# Patient Record
Sex: Male | Born: 1970 | Race: White | Hispanic: No | Marital: Single | Smoking: Never smoker
Health system: Southern US, Community
[De-identification: ages and names within clinical notes are randomized; demographics above are authoritative.]

---

## 2018-01-01 ENCOUNTER — Emergency Department (HOSPITAL_BASED_OUTPATIENT_CLINIC_OR_DEPARTMENT_OTHER)
Admission: EM | Admit: 2018-01-01 | Discharge: 2018-01-01 | Disposition: A | Discharge status: Home / Self Care | Payer: Self-pay | Attending: Emergency Medicine | Admitting: Emergency Medicine

## 2018-01-01 ENCOUNTER — Encounter (HOSPITAL_BASED_OUTPATIENT_CLINIC_OR_DEPARTMENT_OTHER): Payer: Self-pay | Admitting: Emergency Medicine

## 2018-01-01 ENCOUNTER — Other Ambulatory Visit: Payer: Self-pay

## 2018-01-01 ENCOUNTER — Emergency Department (HOSPITAL_BASED_OUTPATIENT_CLINIC_OR_DEPARTMENT_OTHER): Payer: Self-pay

## 2018-01-01 DIAGNOSIS — Z79899 Other long term (current) drug therapy: Secondary | ICD-10-CM | POA: Insufficient documentation

## 2018-01-01 DIAGNOSIS — S42201A Unspecified fracture of upper end of right humerus, initial encounter for closed fracture: Secondary | ICD-10-CM | POA: Insufficient documentation

## 2018-01-01 DIAGNOSIS — Y999 Unspecified external cause status: Secondary | ICD-10-CM | POA: Insufficient documentation

## 2018-01-01 DIAGNOSIS — X509XXA Other and unspecified overexertion or strenuous movements or postures, initial encounter: Secondary | ICD-10-CM | POA: Insufficient documentation

## 2018-01-01 DIAGNOSIS — Y92013 Bedroom of single-family (private) house as the place of occurrence of the external cause: Secondary | ICD-10-CM | POA: Insufficient documentation

## 2018-01-01 DIAGNOSIS — Y9389 Activity, other specified: Secondary | ICD-10-CM | POA: Insufficient documentation

## 2018-01-01 DIAGNOSIS — S43014A Anterior dislocation of right humerus, initial encounter: Secondary | ICD-10-CM | POA: Insufficient documentation

## 2018-01-01 DIAGNOSIS — I10 Essential (primary) hypertension: Secondary | ICD-10-CM | POA: Insufficient documentation

## 2018-01-01 MED ORDER — OXYCODONE-ACETAMINOPHEN 5-325 MG PO TABS
1.0000 | ORAL_TABLET | Freq: Once | ORAL | Status: AC
  Administered 2018-01-01: 1 via ORAL
  Filled 2018-01-01: qty 1

## 2018-01-01 MED ORDER — PROPOFOL 10 MG/ML IV BOLUS
INTRAVENOUS | Status: AC | PRN
  Administered 2018-01-01: 3 mg via INTRAVENOUS

## 2018-01-01 MED ORDER — PROPOFOL 10 MG/ML IV BOLUS
INTRAVENOUS | Status: AC
  Filled 2018-01-01: qty 20

## 2018-01-01 MED ORDER — HYDROMORPHONE HCL 1 MG/ML IJ SOLN
INTRAMUSCULAR | Status: AC
  Filled 2018-01-01: qty 1

## 2018-01-01 MED ORDER — LIDOCAINE HCL 1 % IJ SOLN
INTRAMUSCULAR | Status: AC
  Filled 2018-01-01: qty 20

## 2018-01-01 MED ORDER — PROPOFOL 10 MG/ML IV BOLUS
INTRAVENOUS | Status: AC | PRN
  Administered 2018-01-01: 10 mg via INTRAVENOUS

## 2018-01-01 MED ORDER — PROPOFOL 10 MG/ML IV BOLUS
0.5000 mg/kg | Freq: Once | INTRAVENOUS | Status: DC
  Filled 2018-01-01: qty 20

## 2018-01-01 MED ORDER — HYDROMORPHONE HCL 1 MG/ML IJ SOLN
1.0000 mg | Freq: Once | INTRAMUSCULAR | Status: AC
  Administered 2018-01-01: 1 mg via INTRAVENOUS
  Filled 2018-01-01: qty 1

## 2018-01-01 MED ORDER — PROPOFOL 10 MG/ML IV BOLUS
INTRAVENOUS | Status: AC | PRN
  Administered 2018-01-01: 20 mg via INTRAVENOUS

## 2018-01-01 MED ORDER — KETAMINE HCL 10 MG/ML IJ SOLN
INTRAMUSCULAR | Status: AC | PRN
  Administered 2018-01-01: 83.9 mg via INTRAVENOUS

## 2018-01-01 MED ORDER — PROPOFOL 10 MG/ML IV BOLUS
INTRAVENOUS | Status: AC | PRN
  Administered 2018-01-01: 20 mg via INTRAVENOUS
  Administered 2018-01-01: 45 mg via INTRAVENOUS
  Administered 2018-01-01: 20 mg via INTRAVENOUS
  Administered 2018-01-01: 40 mg via INTRAVENOUS
  Administered 2018-01-01: 20 mg via INTRAVENOUS

## 2018-01-01 MED ORDER — HYDROMORPHONE HCL 1 MG/ML IJ SOLN
1.0000 mg | Freq: Once | INTRAMUSCULAR | Status: AC
  Administered 2018-01-01: 1 mg via INTRAVENOUS

## 2018-01-01 MED ORDER — KETOROLAC TROMETHAMINE 15 MG/ML IJ SOLN
15.0000 mg | Freq: Once | INTRAMUSCULAR | Status: AC
  Administered 2018-01-01: 15 mg via INTRAVENOUS
  Filled 2018-01-01: qty 1

## 2018-01-01 MED ORDER — KETAMINE HCL 10 MG/ML IJ SOLN
INTRAMUSCULAR | Status: AC
  Filled 2018-01-01: qty 1

## 2018-01-01 MED ORDER — SODIUM CHLORIDE 0.9 % IV BOLUS: 500.0000 mL | Freq: Once | INTRAVENOUS | Status: DC

## 2018-01-01 MED ORDER — PROPOFOL 10 MG/ML IV BOLUS
INTRAVENOUS | Status: AC | PRN
  Administered 2018-01-01 (×3): 40 mg via INTRAVENOUS
  Administered 2018-01-01: 83.9 mg via INTRAVENOUS
  Administered 2018-01-01: 20 mg via INTRAVENOUS
  Administered 2018-01-01: 40 mg via INTRAVENOUS

## 2018-01-01 MED ORDER — ONDANSETRON HCL 4 MG/2ML IJ SOLN
4.0000 mg | Freq: Once | INTRAMUSCULAR | Status: AC
  Administered 2018-01-01: 4 mg via INTRAVENOUS
  Filled 2018-01-01: qty 2

## 2018-01-01 MED ORDER — HYDROCODONE-ACETAMINOPHEN 5-325 MG PO TABS: 1.0000 | ORAL_TABLET | Freq: Four times a day (QID) | ORAL | 0 refills | Status: AC | PRN

## 2018-01-01 MED FILL — HYDROCODON-APAP 5-325: 5-325 | 2 days supply | Qty: 10 | Fill #0

## 2018-01-01 NOTE — ED Provider Notes (Signed)
[facility] EMERGENCY DEPARTMENT Provider Note   CSN: [id number] Arrival date & time: 01/01/18  0736     History   Chief Complaint Chief Complaint  Patient presents with  . Shoulder Injury    HPI Daniel Francis is a 47 y.o. male.  The history is provided by the patient.  Shoulder Injury  This is a recurrent problem. The current episode started 1 to 2 hours ago. The problem occurs constantly. The problem has not changed since onset.Associated symptoms comments: Severe pain in the right shoulder and unable to move.  Pain is 10/10.  She states he was sleeping this morning with his right arm behind his head and he went to get out of bed and felt a pop and has been unable to move his arm since.  He has had now 3 shoulder dislocations in the last 2 months.  He states prior to that he has had injury to the shoulder from years of wrestling.  He denies any numbness or tingling in his right hand.  He has never seen a specialist about this problem.. The symptoms are aggravated by bending and twisting. Nothing relieves the symptoms. He has tried rest for the symptoms. The treatment provided no relief.    History reviewed. No pertinent past medical history.  There are no active problems to display for this patient.         Home Medications    Prior to Admission medications   Medication Sig Start Date End Date Taking? Authorizing Provider  LOSARTAN POTASSIUM PO Take by mouth.   Yes [provider]    Family History History reviewed. No pertinent family history.  Social History Social History   Tobacco Use  . Smoking status: Not on file  Substance Use Topics  . Alcohol use: Not on file  . Drug use: Not on file     Allergies   Penicillins   Review of Systems Review of Systems  All other systems reviewed and are negative.    Physical Exam Updated Vital Signs BP (!) 137/97   Pulse 81   Temp 98.3 F (36.8 C) (Oral)   Resp 16   Ht 5\' 6"  (1.676 m)    Wt 83.9 kg   SpO2 100%   BMI 29.86 kg/m   Physical Exam  Constitutional: He is oriented to person, place, and time. He appears well-developed and well-nourished. No distress.  HENT:  Head: Normocephalic and atraumatic.  Mouth/Throat: Oropharynx is clear and moist.  Eyes: Pupils are equal, round, and reactive to light. Conjunctivae and EOM are normal.  Neck: Normal range of motion. Neck supple.  Cardiovascular: Normal rate, regular rhythm and intact distal pulses.  No murmur heard. Pulmonary/Chest: Effort normal and breath sounds normal. No respiratory distress. He has no wheezes. He has no rales.  Abdominal: Soft. He exhibits no distension. There is no tenderness. There is no rebound and no guarding.  Musculoskeletal: He exhibits tenderness. He exhibits no edema.       Right shoulder: He exhibits decreased range of motion, tenderness, bony tenderness and deformity. He exhibits normal pulse and normal strength.       Arms: Neurological: He is alert and oriented to person, place, and time.  Skin: Skin is warm and dry. No rash noted. No erythema.  Psychiatric: He has a normal mood and affect. His behavior is normal.  Nursing note and vitals reviewed.    ED Treatments / Results  Labs (all labs ordered are listed, but only  abnormal results are displayed) Labs Reviewed - No data to display  EKG None  Radiology Dg Shoulder 1 View Right  Result Date: 01/01/2018 CLINICAL DATA:  Post reduction of anterior dislocation of the proximal right humerus. EXAM: RIGHT SHOULDER - 1 VIEW 10:13 a.m. COMPARISON:  Radiograph dated 01/01/2018 at 8:12 a.m. FINDINGS: Single AP view demonstrates reduction of the anterior dislocation. Hill-Sachs lesion visible. The small bony avulsion noted on the prior study is not visible on this image. Moderate AC joint arthropathy. Possible hairline fracture of the inferior rim of the glenoid. IMPRESSION: Reduction of anterior dislocation. 1. Reduction of the anterior  dislocation of the humeral head. 2. Hill-Sachs impaction fracture. 3. Possible inferior glenoid rim fracture. 4. The bone avulsion seen on the prior study of this same date is not appreciable on this exam. Electronically Signed   By: Francene Boyers M.D.   On: 01/01/2018 10:51   Dg Shoulder Right  Result Date: 01/01/2018 CLINICAL DATA:  Injury after rolling over in bed this morning EXAM: RIGHT SHOULDER - 1 VIEW COMPARISON:  None FINDINGS: Single AP view. Inferior RIGHT glenohumeral dislocation identified. Slightly ill-defined density is seen adjacent to the RIGHT glenohumeral joint. Osseous mineralization normal. Degenerative changes at RIGHT [facility] joint. No additional fractures identified. Visualized RIGHT ribs intact. IMPRESSION: Inferior RIGHT glenohumeral dislocation. Calcific density adjacent to the shoulder joint is seen, cannot exclude an atypical avulsion fragment though this could be related to other causes including calcific tendinopathy, calcified loose body, capsular calcification. Electronically Signed   By: Ulyses Southward M.D.   On: 01/01/2018 08:49    Procedures .Sedation Date/Time: 01/01/2018 10:24 AM Performed by: Gwyneth Sprout, MD Authorized by: Gwyneth Sprout, MD   Consent:    Consent obtained:  Verbal   Consent given by:  Patient   Risks discussed:  Allergic reaction, dysrhythmia, inadequate sedation, nausea, prolonged hypoxia resulting in organ damage, prolonged sedation necessitating reversal, respiratory compromise necessitating ventilatory assistance and intubation and vomiting   Alternatives discussed:  Analgesia without sedation, anxiolysis and regional anesthesia Universal protocol:    Procedure explained and questions answered to patient or proxy's satisfaction: yes     Relevant documents present and verified: yes     Test results available and properly labeled: yes     Imaging studies available: yes     Required blood products, implants, devices, and special  equipment available: yes     Site/side marked: yes     Immediately prior to procedure a time out was called: yes     Patient identity confirmation method:  Verbally with patient Indications:    Procedure performed:  Dislocation reduction   Procedure necessitating sedation performed by:  Physician performing sedation Pre-sedation assessment:    Time since last food or drink:  Yesterday   ASA classification: class 1 - normal, healthy patient     Neck mobility: normal     Mouth opening:  3 or more finger widths   Thyromental distance:  4 finger widths   Mallampati score:  I - soft palate, uvula, fauces, pillars visible   Pre-sedation assessments completed and reviewed: airway patency, cardiovascular function, hydration status, mental status, nausea/vomiting, pain level, respiratory function and temperature     Pre-sedation assessment completed:  01/01/2018 9:25 AM Immediate pre-procedure details:    Reassessment: Patient reassessed immediately prior to procedure     Reviewed: vital signs, relevant labs/tests and NPO status     Verified: bag valve mask available, emergency equipment available, intubation equipment available, IV patency  confirmed, oxygen available and suction available   Procedure details (see MAR for exact dosages):    Preoxygenation:  Nasal cannula   Sedation:  Propofol and ketamine   Analgesia:  Hydromorphone   Intra-procedure monitoring:  Blood pressure monitoring, cardiac monitor, continuous pulse oximetry, frequent LOC assessments, frequent vital sign checks and continuous capnometry   Intra-procedure events: hypoxia     Intra-procedure management:  Airway repositioning   Total Provider sedation time (minutes):  45 Post-procedure details:    Post-sedation assessment completed:  01/01/2018 10:25 AM   Attendance: Constant attendance by certified staff until patient recovered     Recovery: Patient returned to pre-procedure baseline     Post-sedation assessments  completed and reviewed: airway patency, cardiovascular function, hydration status, mental status, nausea/vomiting, pain level, respiratory function and temperature     Patient is stable for discharge or admission: yes     Patient tolerance:  Tolerated well, no immediate complications Comments:     Patient initially attempted to be sedated with propofol.  Started with 0.5/kg and used a full 1/kg without adequate sedation.  Due to unable to get complete sedation and relaxation ketamine 1/kg was attempted.  This made patient extremely rigid and tense unable to reduce the shoulder.  A 15 to 20-minute period of time was used to allow patient to completely wake up from his sedation and joint was injected without pain control.  Patient then was sedated one last time with propofol.  This time patient required 2.5 mg/kg to get adequate sedation which then resulted in reduction of his right shoulder dislocation.  Patient had a brief episode of hypoxia which improved with jaw thrust for less than 1 minute. Reduction of dislocation Date/Time: 01/01/2018 10:27 AM Performed by: Gwyneth Sprout, MD Authorized by: Gwyneth Sprout, MD  Consent: Written consent obtained. Risks and benefits: risks, benefits and alternatives were discussed Consent given by: patient Patient understanding: patient states understanding of the procedure being performed Patient consent: the patient's understanding of the procedure matches consent given Procedure consent: procedure consent matches procedure scheduled Relevant documents: relevant documents present and verified Imaging studies: imaging studies available Patient identity confirmed: verbally with patient Time out: Immediately prior to procedure a "time out" was called to verify the correct patient, procedure, equipment, support staff and site/side marked as required. Local anesthesia used: yes Anesthesia: local infiltration  Anesthesia: Local anesthesia used:  yes Local Anesthetic: lidocaine 1% without epinephrine Anesthetic total: 10 mL  Sedation: Patient sedated: yes Sedation type: moderate (conscious) sedation Sedatives: propofol Analgesia: hydromorphone Vitals: Vital signs were monitored during sedation.  Patient tolerance: Patient tolerated the procedure well with no immediate complications Comments: Right shoulder dislocation reduced with arm flexion and external rotation.  Postreduction 2+ radial pulse with normal sensation over the deltoid and in the hand.    (including critical care time)  Medications Ordered in ED Medications  propofol (DIPRIVAN) 10 mg/mL bolus/IV push 42 mg (has no administration in time range)  sodium chloride 0.9 % bolus 500 mL (has no administration in time range)  ketamine (KETALAR) 10 MG/ML injection (has no administration in time range)  lidocaine (XYLOCAINE) 1 % (with pres) injection (has no administration in time range)  HYDROmorphone (DILAUDID) injection 1 mg (1 mg Intravenous Given 01/01/18 0821)  ondansetron (ZOFRAN) injection 4 mg (4 mg Intravenous Given 01/01/18 0821)  propofol (DIPRIVAN) 10 mg/mL bolus/IV push ( Intravenous Canceled Entry 01/01/18 1000)  propofol (DIPRIVAN) 10 mg/mL bolus/IV push (20 mg Intravenous Given 01/01/18 0913)  ketamine (KETALAR)  injection (83.9 mg Intravenous Given 01/01/18 0919)  propofol (DIPRIVAN) 10 mg/mL bolus/IV push (10 mg Intravenous Given 01/01/18 0926)  propofol (DIPRIVAN) 10 mg/mL bolus/IV push (3 mg Intravenous Given 01/01/18 0927)  HYDROmorphone (DILAUDID) injection 1 mg (1 mg Intravenous Given 01/01/18 0949)  propofol (DIPRIVAN) 10 mg/mL bolus/IV push (40 mg Intravenous Given 01/01/18 1006)     Initial Impression / Assessment and Plan / ED Course  I have reviewed the triage vital signs and the nursing notes.  Pertinent labs & imaging results that were available during my care of the patient were reviewed by me and considered in my medical decision  making (see chart for details).     Patient presenting with recurrent right shoulder dislocation.  Patient was too uncomfortable to reduce with just lidocaine.  X-ray confirmed dislocation.  Patient was extremely resistant to sedation.  Propofol was given initially at 1-1/2 mg/kg without adequate sedation.  Then attempted to use ketamine which caused rigidity and was not helpful.  After giving the patient a.  Of time to relax and wake up a second attempt with propofol was used.  Patient received between 2.5 to 3 mg/kg of propofol which did result in relaxation and shoulder was able to be reduced.  He is neurovascularly intact.  He is feeling much better.  X-ray confirmed reduction.  Patient will need to follow-up with an orthopedist given this is now his third dislocation in 2 months.  11:26 AM X-ray confirmed reduction however there is a Hill-Sachs impaction fracture and possible inferior glenoid rim fracture.  Findings were discussed with the patient.  He was placed in a shoulder immobilizer.  Patient is also hypertensive today but states he has a history of hypertension and is not taking his medication.  He will take his medication when he gets home and to keep an eye on it and if it remains elevated he will follow-up with his PCP. Final Clinical Impressions(s) / ED Diagnoses   Final diagnoses:  Anterior dislocation of right shoulder, initial encounter    ED Discharge Orders         Ordered    HYDROcodone-acetaminophen (NORCO/VICODIN) 5-325 MG tablet  Every 6 hours PRN     01/01/18 1128           Gwyneth Sprout, MD 01/01/18 1130

## 2018-01-01 NOTE — ED Triage Notes (Signed)
Reports rolling over in bed this morning and right shoulder popping.  States previous dislocations x 2 in the past 2 months.

## 2018-01-01 NOTE — Discharge Instructions (Signed)
Wear the sling immobilizer all the time except when showering.  Make sure you do not raise it your right arm above your head.  Take ibuprofen for the pain but you were prescribed a stronger pain medication if that does not work.  Ice intermittently no more than 20 minutes at a time for the next few days.

## 2018-01-10 ENCOUNTER — Ambulatory Visit (INDEPENDENT_AMBULATORY_CARE_PROVIDER_SITE_OTHER): Payer: Self-pay

## 2018-01-10 ENCOUNTER — Ambulatory Visit (INDEPENDENT_AMBULATORY_CARE_PROVIDER_SITE_OTHER): Payer: Self-pay | Admitting: Orthopaedic Surgery

## 2018-01-10 ENCOUNTER — Encounter (INDEPENDENT_AMBULATORY_CARE_PROVIDER_SITE_OTHER): Payer: Self-pay

## 2018-01-10 ENCOUNTER — Encounter (INDEPENDENT_AMBULATORY_CARE_PROVIDER_SITE_OTHER): Payer: Self-pay | Admitting: Orthopaedic Surgery

## 2018-01-10 DIAGNOSIS — M25511 Pain in right shoulder: Secondary | ICD-10-CM

## 2018-01-10 MED ORDER — HYDROCODONE-ACETAMINOPHEN 5-325 MG PO TABS: 1.0000 | ORAL_TABLET | Freq: Every evening | ORAL | 0 refills | Status: AC | PRN

## 2018-01-10 MED FILL — HYDROCODON-APAP 5-325: 5-325 | 10 days supply | Qty: 20 | Fill #0

## 2018-01-10 NOTE — Progress Notes (Signed)
Office Visit Note   Patient: Daniel Francis           Date of Birth: Oct 17, 1970           MRN: [id number] Visit Date: 01/10/2018              Requested by: Lasandra BeechSwaney, Cathy Judge, MD [address] [place], [place] [address] PCP: Lasandra BeechSwaney, Cathy Judge, MD   Assessment & Plan: Visit Diagnoses:  1. Right shoulder pain, unspecified chronicity     Plan: Impression is 47 year old gentleman with his third right shoulder dislocation.  I reviewed his x-rays from the ED which shows a small fracture from the inferior glenoid presumably a bony Bankart.  We discussed the possibility of rotator cuff tear and future dislocations.  Certainly we will try nonsurgical treatment as a first option.  I have given him a work note with restrictions.  He needs to wear his sling at all times.  I would like to reevaluate him in 2 weeks to begin physical therapy.  Patient understands that he may need surgery if he continues to have dislocation or rotator cuff issues.  Follow-Up Instructions: Return in about 2 weeks (around 01/24/2018).   Orders:  Orders Placed This Encounter  Procedures  . XR Shoulder 1V Right   Meds ordered this encounter  Medications  . HYDROcodone-acetaminophen (NORCO) 5-325 MG tablet    Sig: Take 1-2 tablets by mouth at bedtime as needed.    Dispense:  20 tablet    Refill:  0      Procedures: No procedures performed   Clinical Data: No additional findings.   Subjective: Chief Complaint  Patient presents with  . Right Shoulder - Pain    Daniel Francis is a 47 year old right-hand-dominant gentleman who recently dislocated his right shoulder about 9 days ago while rolling over in bed.  This is his third dislocation.  He states that he had his first dislocation when he fell into a ditch and his father's funeral in September.  He did not undergo formal physical therapy or rehab.  He states that he complains of some numbness and discomfort with use of the shoulder.  He is having trouble  with elevating his arm.   Review of Systems  Constitutional: Negative.   All other systems reviewed and are negative.    Objective: Vital Signs: There were no vitals taken for this visit.  Physical Exam  Constitutional: He is oriented to person, place, and time. He appears well-developed and well-nourished.  HENT:  Head: Normocephalic and atraumatic.  Eyes: Pupils are equal, round, and reactive to light.  Neck: Neck supple.  Pulmonary/Chest: Effort normal.  Abdominal: Soft.  Musculoskeletal: Normal range of motion.  Neurological: He is alert and oriented to person, place, and time.  Skin: Skin is warm.  Psychiatric: He has a normal mood and affect. His behavior is normal. Judgment and thought content normal.  Nursing note and vitals reviewed.   Ortho Exam Right shoulder exam shows mild limitation with passive range of motion secondary to guarding.  Positive apprehension.  Forward flexion is normal up to 100 degrees.  He is markedly weak in his infraspinatus and mildly weak with supraspinatus.  Belly press is mildly weak. Specialty Comments:  No specialty comments available.  Imaging: Xr Shoulder 1v Right  Result Date: 01/10/2018 Concentric shoulder reduction.    PMFS History: There are no active problems to display for this patient.  History reviewed. No pertinent past medical history.  History reviewed.  No pertinent family history.  History reviewed. No pertinent surgical history. Social History   Occupational History  . Not on file  Tobacco Use  . Smoking status: Never Smoker  . Smokeless tobacco: Never Used  Substance and Sexual Activity  . Alcohol use: Not on file  . Drug use: Not on file  . Sexual activity: Not on file

## 2018-01-24 ENCOUNTER — Ambulatory Visit (INDEPENDENT_AMBULATORY_CARE_PROVIDER_SITE_OTHER): Payer: Self-pay | Admitting: Orthopaedic Surgery

## 2018-01-24 DIAGNOSIS — M25511 Pain in right shoulder: Secondary | ICD-10-CM

## 2018-01-24 MED ORDER — HYDROCODONE-ACETAMINOPHEN 5-325 MG PO TABS: 1.0000 | ORAL_TABLET | Freq: Every day | ORAL | 0 refills | Status: AC | PRN

## 2018-01-24 MED FILL — HYDROCODON-APAP 5-325: 5-325 | 10 days supply | Qty: 20 | Fill #0

## 2018-01-24 NOTE — Progress Notes (Signed)
   Office Visit Note   Patient: Daniel Francis           Date of Birth: 08/14/1970           MRN: [id number] Visit Date: 01/24/2018              Requested by: Lasandra BeechSwaney, Cathy Judge, MD [address] [place], [place] [address] PCP: Lasandra BeechSwaney, Cathy Judge, MD   Assessment & Plan: Visit Diagnoses:  1. Right shoulder pain, unspecified chronicity     Plan: At this point we will refer him to physical therapy for strengthening and range of motion.  Recheck in 4 weeks.  Norco refill today.  Continue same work restrictions.  He needs to wear his sling at work and in public.  Follow-Up Instructions: Return in about 4 weeks (around 02/21/2018).   Orders:  No orders of the defined types were placed in this encounter.  Meds ordered this encounter  Medications  . HYDROcodone-acetaminophen (NORCO) 5-325 MG tablet    Sig: Take 1-2 tablets by mouth daily as needed.    Dispense:  20 tablet    Refill:  0      Procedures: No procedures performed   Clinical Data: No additional findings.   Subjective: Chief Complaint  Patient presents with  . Right Shoulder - Pain, Follow-up    Daniel Francis follows up today 3 weeks status post right shoulder dislocation with likely small bony Bankart.   Review of Systems   Objective: Vital Signs: There were no vitals taken for this visit.  Physical Exam  Ortho Exam Right shoulder exam shows improvement in range of motion although supraspinatus infraspinatus are intact but with moderate weakness. Specialty Comments:  No specialty comments available.  Imaging: No results found.   PMFS History: There are no active problems to display for this patient.  No past medical history on file.  No family history on file.  No past surgical history on file. Social History   Occupational History  . Not on file  Tobacco Use  . Smoking status: Never Smoker  . Smokeless tobacco: Never Used  Substance and Sexual Activity  . Alcohol use: Not on file  .  Drug use: Not on file  . Sexual activity: Not on file

## 2018-01-24 NOTE — ED Notes (Signed)
Late entry-patient did not go home by himself.  Patient went home with spouse.

## 2018-01-30 ENCOUNTER — Ambulatory Visit: Payer: Self-pay | Admitting: Physical Therapy

## 2018-01-31 ENCOUNTER — Ambulatory Visit: Payer: Self-pay | Attending: Orthopaedic Surgery | Admitting: Rehabilitative and Restorative Service Providers"

## 2018-01-31 ENCOUNTER — Encounter: Payer: Self-pay | Admitting: Rehabilitative and Restorative Service Providers"

## 2018-01-31 DIAGNOSIS — M25511 Pain in right shoulder: Secondary | ICD-10-CM | POA: Insufficient documentation

## 2018-01-31 DIAGNOSIS — M6281 Muscle weakness (generalized): Secondary | ICD-10-CM | POA: Insufficient documentation

## 2018-01-31 DIAGNOSIS — R29898 Other symptoms and signs involving the musculoskeletal system: Secondary | ICD-10-CM | POA: Insufficient documentation

## 2018-01-31 DIAGNOSIS — R293 Abnormal posture: Secondary | ICD-10-CM | POA: Insufficient documentation

## 2018-01-31 NOTE — Patient Instructions (Signed)
Axial Extension (Chin Tuck)    Pull chin in and lengthen back of neck. Hold __5__ seconds while counting out loud. Repeat __10__ times. Do __several__ sessions per day.  Shoulder Blade Squeeze    Rotate shoulders back, then squeeze shoulder blades down and back Hold 10 sec Repeat _10 ___ times. Do __several __ sessions per day.  Upper Back Strength: Lower Trapezius / Rotator Cuff " L's Keep chest up; shoulder blades down and back -  " NO PAIN!!    Arms in waitress pose, palms up. Press hands back and slide shoulder blades down. Hold for __5__ seconds. Repeat _10___ times. 1-2 times per day.    Neurovascular: Median Nerve Glide With Cervical Bias - Supine    Lie with neck supported, right arm out to side, elbow straight, thumb down, fingers and wrist bent back. Slowly move opposite side ear toward shoulder as far as possible without pain. Hold 1 minute Repeat _2___ times per set. Do __2 times/day   TENS UNIT: This is helpful for muscle pain and spasm.   Search and Purchase a TENS 7000 2nd edition at www.tenspros.com. It should be less than $30.     TENS unit instructions: Do not shower or bathe with the unit on Turn the unit off before removing electrodes or batteries If the electrodes lose stickiness add a drop of water to the electrodes after they are disconnected from the unit and place on plastic sheet. If you continued to have difficulty, call the TENS unit company to purchase more electrodes. Do not apply lotion on the skin area prior to use. Make sure the skin is clean and dry as this will help prolong the life of the electrodes. After use, always check skin for unusual red areas, rash or other skin difficulties. If there are any skin problems, does not apply electrodes to the same area. Never remove the electrodes from the unit by pulling the wires. Do not use the TENS unit or electrodes other than as directed. Do not change electrode placement without consultating  your therapist or physician. Keep 2 fingers with between each electrode.    [facility] [address] [place], [place] [address]  [phone] (office) [phone] (fax)

## 2018-01-31 NOTE — Therapy (Signed)
[facility] [address]  [address] [place], [place], [address] Phone: [phone]   Fax:  [phone]  Physical Therapy Evaluation  Patient Details  Name: Haile Bosler MRN: [id number] Date of Birth: May 27, 1970 Referring Provider (PT): Dr Roda Shutters   Encounter Date: 01/31/2018  PT End of Session - 01/31/18 1042    Visit Number  1    Number of Visits  12    Date for PT Re-Evaluation  03/14/18    PT Start Time  0934    PT Stop Time  1041    PT Time Calculation (min)  67 min    Activity Tolerance  Patient tolerated treatment well       History reviewed. No pertinent past medical history.  History reviewed. No pertinent surgical history.  There were no vitals filed for this visit.   Subjective Assessment - 01/31/18 0943    Subjective  Patient reports initial dislocation Rt shoulder early September when he fell landing on the Rt shoulder relocated in ED - second dislocation ~ 1 week later when reaching up - second relocation was done without manipulation.  Dislocation 01/01/18 while lying in bed with hands behind head. He was seen in the ED and shoulder was relocated and pt placed in sling. He is to wear sling as needed.     Pertinent History  HTN; denies any other medical problems     Diagnostic tests  xrays     Patient Stated Goals  99% better - using shoulder normally     Currently in Pain?  Yes    Pain Score  2     Pain Location  Shoulder    Pain Orientation  Right    Pain Descriptors / Indicators  Throbbing    Pain Type  Acute pain    Pain Radiating Towards  into the mid arm area - deltoid    Pain Onset  More than a month ago    Pain Frequency  Intermittent    Aggravating Factors   sleeping/lying down; dressing - putting shirt or sweater     Pain Relieving Factors  resting arm on pillow or arm of chair          [facility] PT Assessment - 01/31/18 0001      Assessment   Medical Diagnosis  Rt shoulder dislocation     Referring Provider (PT)  Dr Roda Shutters    Onset Date/Surgical Date  01/01/18    Hand Dominance  Right    Next MD Visit  02/19/18    Prior Therapy  none      Precautions   Precaution Comments  in sling for support as needed       Balance Screen   Has the patient fallen in the past 6 months  Yes    How many times?  1    Has the patient had a decrease in activity level because of a fear of falling?   No    Is the patient reluctant to leave their home because of a fear of falling?   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     Leisure  yard  workk; Architect; fishing       Observation/Other Assessments   Observations  sits/standing with shoulders rounded Rt UE supported in sling     Focus on Therapeutic Outcomes (FOTO)   47% limitation       Sensation   Additional Comments  some tingling in thumb and fingers - intermittently since initial dislocation - improved       Posture/Postural Control   Posture Comments  head forward; shoulders rounded and elevated; head of the humerus anterior in orientation; scapulae abducted and rotated along the thoracic wall       AROM   Overall AROM Comments  pain or discomfort with all Rt shoulder motioins worst w/ ER/IR/elevation     Right/Left Shoulder  --   assessed in standing/IR/ER in 80-90 deg abd Rt w/PT assist   Right Shoulder Extension  55 Degrees    Right Shoulder Flexion  99 Degrees    Right Shoulder ABduction  95 Degrees    Right Shoulder Internal Rotation  18 Degrees    Right Shoulder External Rotation  28 Degrees    Left Shoulder Extension  52 Degrees    Left Shoulder Flexion  151 Degrees    Left Shoulder ABduction  155 Degrees    Left Shoulder Internal Rotation  40 Degrees    Left Shoulder  External Rotation  90 Degrees    Right/Left Elbow  --   full ROM some pulling in ant shd w/flex/ext Rt    Right/Left Forearm  --   WFL's some pulling in ant Rt shd w/ supinatin/pronation    Right/Left Wrist  --   WFL's no problem      Strength   Overall Strength Comments  not tested resistively - moving UE against gravity       Palpation   Palpation comment  muscular tightness through panterior shoulder - pecs/anteriotr deltoid/biceps; upper trap/leveator Rt shoulder girdle       Special Tests   Other special tests  (+) neural tension test Rt > Lt                 Objective measurements completed on examination: See above findings.      [facility] Adult PT Treatment/Exercise - 01/31/18 0001      Neuro Re-ed    Neuro Re-ed Details   neuromuscular re-ed workiing on posture and alignment - chest lift - posterior shoulder girdle engaged       Shoulder Exercises: Standing   Retraction  AROM;Strengthening;Both;10 reps   scapular retraction 10 sec hold - noodle along spine    Other Standing Exercises  axial extension 10 sec x 5; L's x 10 (no pain ROM to tolerance - good posture and alignment) x 10       Shoulder Exercises: Stretch   Other Shoulder Stretches  neural stretch ~ 1 min x 2 reps NO pain - pt supine Rt UE supported ~ 80 deg; chin tucked/tipped and turned to Rt - elbow in ~ 30 deg flexion       Moist Heat Therapy   Number Minutes Moist Heat  15 Minutes    Moist Heat Location  Shoulder   Rt     Electrical Stimulation   Electrical Stimulation Location  Rt shoulder girdle     Electrical Stimulation Action  IFC    Electrical Stimulation Parameters  to tolerance     Electrical Stimulation Goals  Pain;Tone      Manual Therapy   Manual therapy comments  worked  briefly thorugh the anterior Rt shoulder - tightness through pecs/biceps/trap             PT Education - 01/31/18 1027    Education Details  POC posture and alignment HEP TENs     Person(s) Educated   Patient    Methods  Explanation;Demonstration;Tactile cues;Verbal cues;Handout    Comprehension  Verbalized understanding;Returned demonstration;Verbal cues required;Tactile cues required          PT Long Term Goals - 01/31/18 1051      PT LONG TERM GOAL #1   Title  Improve posture and alignment with patient to demonstrate upright posture with posterior shoulder girdle musculature engaged 03/14/18    Time  6    Period  Weeks    Status  New      PT LONG TERM GOAL #2   Title  Increased AROM Rt shoulder to equal of greater than AROM Lr shoulder 03/14/18    Time  6    Period  Weeks    Status  New      PT LONG TERM GOAL #3   Title  Functional strength 4+/5 to 5/5 Rt UE 03/14/18    Time  6    Period  Weeks    Status  New      PT LONG TERM GOAL #4   Title  Independent in HEP 03/14/18    Time  6    Period  Weeks    Status  New      PT LONG TERM GOAL #5   Title  Improve FOTO to </= 28% limitation 03/14/18    Time  6    Period  Weeks    Status  New             Plan - 01/31/18 1043    Clinical Impression Statement  Patient presents with history to Rt shoulder dislocation - initially early September following a fall with relocation iin ED. He experienced second dislocation ~ 1 week later when reaching up with Rt UE. Resolved in ED. Patient experienced third dislocation 01/01/18 when resting in bed with hands behind his head again requiring relocation in the ED. He has ben in a sling since recent dislocation and remins out of work. Patient has poor posture and alignment; limited AROM Rt shoulder; muscular tightness and tenderness to palpation; (+) neural tension test Rt > Lt UE; intermittent tingling into the Rt thumb and fingers; limited functioinal activity level. Patient will beenfit from PT to address problems identified.     Clinical Presentation due to:  third Rt shoulder dislocation since 9/19    Clinical Decision Making  Low    Rehab Potential  Good    PT Frequency  2x /  week    PT Duration  6 weeks    PT Treatment/Interventions  Patient/family education;ADLs/Self Care Home Management;Cryotherapy;Electrical Stimulation;Iontophoresis 4mg /ml Dexamethasone;Moist Heat;Ultrasound;Dry needling;Manual techniques;Neuromuscular re-education;Functional mobility training;Therapeutic activities;Therapeutic exercise    PT Next Visit Plan  review HEP; continue work on posture and alignment; progress with posterior shoulder girdle strengthening; shoulder ROM; manual work and modalities as indicated     Financial planner with Plan of Care  Patient       Patient will benefit from skilled therapeutic intervention in order to improve the following deficits and impairments:  Postural dysfunction, Improper body mechanics, Pain, Increased fascial restricitons, Increased muscle spasms, Decreased strength, Decreased mobility, Decreased range of motion, Decreased activity tolerance  Visit Diagnosis: Acute pain of right shoulder - Plan:  PT plan of care cert/re-cert  Abnormal posture - Plan: PT plan of care cert/re-cert  Other symptoms and signs involving the musculoskeletal system - Plan: PT plan of care cert/re-cert  Muscle weakness (generalized) - Plan: PT plan of care cert/re-cert     Problem List There are no active problems to display for this patient.   Celyn Rober MinionP Holt PT, MPH  01/31/2018, 10:56 AM  [facility] [address]  [address] [place], [place], [address] Phone: [phone]   Fax:  [phone]  Name: Felton ClintonKevin Derk MRN: [id number] Date of Birth: Feb 03, 1971

## 2018-02-04 ENCOUNTER — Encounter: Payer: Self-pay | Admitting: Physical Therapy

## 2018-02-04 ENCOUNTER — Ambulatory Visit: Payer: Self-pay | Admitting: Physical Therapy

## 2018-02-04 DIAGNOSIS — M25511 Pain in right shoulder: Secondary | ICD-10-CM

## 2018-02-04 DIAGNOSIS — M6281 Muscle weakness (generalized): Secondary | ICD-10-CM

## 2018-02-04 DIAGNOSIS — R293 Abnormal posture: Secondary | ICD-10-CM

## 2018-02-04 DIAGNOSIS — R29898 Other symptoms and signs involving the musculoskeletal system: Secondary | ICD-10-CM

## 2018-02-04 NOTE — Therapy (Signed)
[facility] Outpatient Rehabilitation [facility] [address]  [address] [place], [place], [address] Phone: [phone]   Fax:  [phone]  Physical Therapy Treatment  Patient Details  Name: Daniel Francis MRN: [id number] Date of Birth: 1970-10-29 Referring Provider (PT): Dr Roda Shutters   Encounter Date: 02/04/2018  PT End of Session - 02/04/18 0938    Visit Number  2    Number of Visits  12    Date for PT Re-Evaluation  03/14/18    Authorization Type  Self Pay    PT Start Time  [phone]   pt arrived late   PT Stop Time  1014    PT Time Calculation (min)  36 min    Activity Tolerance  Patient tolerated treatment well       History reviewed. No pertinent past medical history.  History reviewed. No pertinent surgical history.  There were no vitals filed for this visit.  Subjective Assessment - 02/04/18 0940    Subjective  Pt notes most pain/stiffness initially upon rising in the morning.    Pertinent History  HTN; denies any other medical problems     Diagnostic tests  xrays     Patient Stated Goals  99% better - using shoulder normally     Currently in Pain?  No/denies    Pain Onset  More than a month ago                       [facility] Adult PT Treatment/Exercise - 02/04/18 0938      Exercises   Exercises  Shoulder      Shoulder Exercises: Supine   Flexion  Right;AAROM;10 reps    Flexion Limitations  5" hold with wand    Other Supine Exercises  median nerve glide x 60 sec      Shoulder Exercises: Seated   Retraction  Both;10 reps    External Rotation  Both;10 reps   5" hold   External Rotation Limitations  + scap retraction    Other Seated Exercises  Chin tuck 10 x 5 sec      Shoulder Exercises: Standing   Internal Rotation  Right;10 reps;Theraband;Strengthening    Theraband Level (Shoulder Internal Rotation)  Level 1 (Yellow)    Internal Rotation Limitations  neutral shoulder with towel under elbow    Row  Both;10  reps;Theraband;Strengthening   5" hold   Theraband Level (Shoulder Row)  Level 1 (Yellow)      Shoulder Exercises: Pulleys   Flexion  2 minutes      Shoulder Exercises: ROM/Strengthening   Wall Pushups  10 reps      Shoulder Exercises: Isometric Strengthening   External Rotation  --   10 x 5"   ABduction  --   10 x 5"     Manual Therapy   Manual Therapy  Soft tissue mobilization;Passive ROM;Neural Stretch    Soft tissue mobilization  STM/DTM to R posterior shoulder esp teres group, pecs & deltoids    Passive ROM  gentle R shoulder PROM into IR & ER with light stretch    Neural Stretch  R median and ulnar nerve glides             PT Education - 02/04/18 1014    Education Details  HEP update    Person(s) Educated  Patient    Methods  Explanation;Demonstration;Handout    Comprehension  Verbalized understanding;Returned demonstration  PT Long Term Goals - 02/04/18 1015      PT LONG TERM GOAL #1   Title  Improve posture and alignment with patient to demonstrate upright posture with posterior shoulder girdle musculature engaged 03/14/18    Time  6    Period  Weeks    Status  On-going      PT LONG TERM GOAL #2   Title  Increased AROM Rt shoulder to equal of greater than AROM Lr shoulder 03/14/18    Time  6    Period  Weeks    Status  On-going      PT LONG TERM GOAL #3   Title  Functional strength 4+/5 to 5/5 Rt UE 03/14/18    Time  6    Period  Weeks    Status  On-going      PT LONG TERM GOAL #4   Title  Independent in HEP 03/14/18    Time  6    Period  Weeks    Status  On-going      PT LONG TERM GOAL #5   Title  Improve FOTO to </= 28% limitation 03/14/18    Time  6    Period  Weeks    Status  On-going            Plan - 02/04/18 1011    Clinical Impression Statement  Initial HEP reviewed with pt demonstrating good technique will all exercises except median nerve glide - reviewed technique with extensive verbal and tactile cueing but pt  still having difficulty with this. Pt also noting more ulnar nerve distribution symptoms today which was address with PT guided nerve glide, but deferred adding new nerve glide to HEP d/t issues with exisiting HEP. Introduced initial AAROM and light shoulder strengthening with good tolerance, therefore HEP updated accordingly. Posterior shoulder tightness still limiting IR/ER but improved after manual therapy. Pt declining ice/modalities at end of session.    Rehab Potential  Good    PT Frequency  2x / week    PT Duration  6 weeks    PT Treatment/Interventions  Patient/family education;ADLs/Self Care Home Management;Cryotherapy;Electrical Stimulation;Iontophoresis 4mg /ml Dexamethasone;Moist Heat;Ultrasound;Dry needling;Manual techniques;Neuromuscular re-education;Functional mobility training;Therapeutic activities;Therapeutic exercise    PT Next Visit Plan  review updated HEP; continue work on posture and alignment; progress with posterior shoulder girdle strengthening; shoulder ROM; manual work and modalities as indicated     Financial plannerConsulted and Agree with Plan of Care  Patient       Patient will benefit from skilled therapeutic intervention in order to improve the following deficits and impairments:  Postural dysfunction, Improper body mechanics, Pain, Increased fascial restricitons, Increased muscle spasms, Decreased strength, Decreased mobility, Decreased range of motion, Decreased activity tolerance  Visit Diagnosis: Acute pain of right shoulder  Abnormal posture  Other symptoms and signs involving the musculoskeletal system  Muscle weakness (generalized)     Problem List There are no active problems to display for this patient.   Marry GuanJoAnne M Taelar Gronewold, PT, MPT 02/04/2018, 12:08 PM  [facility] [address]  [address] [place], [place], [address] Phone: [phone]   Fax:  [phone]  Name: Daniel ClintonKevin Norville MRN: [id number] Date of Birth:  17-Feb-1971

## 2018-02-06 ENCOUNTER — Ambulatory Visit: Payer: Self-pay

## 2018-02-06 DIAGNOSIS — M25511 Pain in right shoulder: Secondary | ICD-10-CM

## 2018-02-06 DIAGNOSIS — M6281 Muscle weakness (generalized): Secondary | ICD-10-CM

## 2018-02-06 DIAGNOSIS — R29898 Other symptoms and signs involving the musculoskeletal system: Secondary | ICD-10-CM

## 2018-02-06 DIAGNOSIS — R293 Abnormal posture: Secondary | ICD-10-CM

## 2018-02-06 NOTE — Therapy (Signed)
[facility] [address]  [address] [place], [place], [address] Phone: [phone]   Fax:  [phone]  Physical Therapy Treatment  Patient Details  Name: Daniel Francis MRN: [id number] Date of Birth: 03/30/1970 Referring Provider (PT): Dr Roda ShuttersXu   Encounter Date: 02/06/2018  PT End of Session - 02/06/18 1006    Visit Number  3    Number of Visits  12    Date for PT Re-Evaluation  03/14/18    Authorization Type  Self Pay    PT Start Time  0930    PT Stop Time  1014    PT Time Calculation (min)  44 min    Activity Tolerance  Patient tolerated treatment well    Behavior During Therapy  [facility] for tasks assessed/performed       No past medical history on file.  No past surgical history on file.  There were no vitals filed for this visit.  Subjective Assessment - 02/06/18 0932    Subjective  Pt. reporting he felt fine after last visit.  has had some R shoulder pain while laying on that side at night.      Pertinent History  HTN; denies any other medical problems     Patient Stated Goals  99% better - using shoulder normally     Currently in Pain?  No/denies    Pain Score  0-No pain    Multiple Pain Sites  No                       [facility] Adult PT Treatment/Exercise - 02/06/18 0943      Shoulder Exercises: Supine   Protraction  Right;15 reps    Protraction Weight (lbs)  1    External Rotation  Right;10 reps;AAROM    External Rotation Limitations  wand; cues to avoid painful end range     Flexion  Right;AAROM;15 reps    Flexion Limitations  5" hold with wand    Other Supine Exercises  R shoulder circles CW, CCW 2# x 10 reps       Shoulder Exercises: Sidelying   ABduction  Right;10 reps    ABduction Limitations  scaption 0-90 dg; cues for slow performance required       Shoulder Exercises: Standing   Internal Rotation  Right;Theraband;Strengthening;15 reps    Theraband Level (Shoulder Internal Rotation)   Level 1 (Yellow)    Internal Rotation Limitations  neutral shoulder with towel under elbow    Extension  10 reps;Both;Strengthening;Theraband    Theraband Level (Shoulder Extension)  Level 1 (Yellow)    Extension Limitations  Cues for scapular retraction required     Row  15 reps;Both;Theraband;Strengthening    Theraband Level (Shoulder Row)  Level 1 (Yellow)    Row Limitations  Cues for scapular retraction       Shoulder Exercises: Pulleys   Flexion  3 minutes    Scaption  3 minutes   well tolerated    Scaption Limitations  scaption/flexion       Shoulder Exercises: ROM/Strengthening   Wall Pushups  15 reps    Wall Pushups Limitations  min cues for elbows in neutral       Shoulder Exercises: Isometric Strengthening   External Rotation  --   5" x 15 reps    ABduction  --   5" x 15 reps  PT Long Term Goals - 02/04/18 1015      PT LONG TERM GOAL #1   Title  Improve posture and alignment with patient to demonstrate upright posture with posterior shoulder girdle musculature engaged 03/14/18    Time  6    Period  Weeks    Status  On-going      PT LONG TERM GOAL #2   Title  Increased AROM Rt shoulder to equal of greater than AROM Lr shoulder 03/14/18    Time  6    Period  Weeks    Status  On-going      PT LONG TERM GOAL #3   Title  Functional strength 4+/5 to 5/5 Rt UE 03/14/18    Time  6    Period  Weeks    Status  On-going      PT LONG TERM GOAL #4   Title  Independent in HEP 03/14/18    Time  6    Period  Weeks    Status  On-going      PT LONG TERM GOAL #5   Title  Improve FOTO to </= 28% limitation 03/14/18    Time  6    Period  Weeks    Status  On-going            Plan - 02/06/18 1007    Clinical Impression Statement  Daniel Francis doing well today and reports daily adherence to HEP.  Still reporting intermittent tingling into R UE however not experiencing this today.  Tolerated mild progression of scapular/RTC strengthening activities  today well today.  Initiated sidelying scaption today 0-90 dg with cueing for slow pacing which was tolerated well.  Will monitor response to therex progression in coming visits.      Rehab Potential  Good    PT Frequency  2x / week    PT Duration  6 weeks    PT Treatment/Interventions  Patient/family education;ADLs/Self Care Home Management;Cryotherapy;Electrical Stimulation;Iontophoresis 4mg /ml Dexamethasone;Moist Heat;Ultrasound;Dry needling;Manual techniques;Neuromuscular re-education;Functional mobility training;Therapeutic activities;Therapeutic exercise    PT Next Visit Plan  Monitor response to therex progression; ontinue work on posture and alignment; progress with posterior shoulder girdle strengthening; shoulder ROM; manual work and modalities as indicated     Financial planner with Plan of Care  Patient       Patient will benefit from skilled therapeutic intervention in order to improve the following deficits and impairments:  Postural dysfunction, Improper body mechanics, Pain, Increased fascial restricitons, Increased muscle spasms, Decreased strength, Decreased mobility, Decreased range of motion, Decreased activity tolerance  Visit Diagnosis: Acute pain of right shoulder  Abnormal posture  Other symptoms and signs involving the musculoskeletal system  Muscle weakness (generalized)     Problem List There are no active problems to display for this patient.   Kermit Balo, PTA 02/06/18 10:34 AM   [facility] [address]  [address] [place], [place], [address] Phone: [phone]   Fax:  [phone]  Name: Daniel Francis MRN: [id number] Date of Birth: [birth year]

## 2018-02-12 ENCOUNTER — Ambulatory Visit: Payer: Self-pay

## 2018-02-12 DIAGNOSIS — M25511 Pain in right shoulder: Secondary | ICD-10-CM

## 2018-02-12 DIAGNOSIS — R29898 Other symptoms and signs involving the musculoskeletal system: Secondary | ICD-10-CM

## 2018-02-12 DIAGNOSIS — M6281 Muscle weakness (generalized): Secondary | ICD-10-CM

## 2018-02-12 DIAGNOSIS — R293 Abnormal posture: Secondary | ICD-10-CM

## 2018-02-12 NOTE — Therapy (Signed)
[facility] [address]  [address] [place], [place], [address] Phone: [phone]   Fax:  [phone]  Physical Therapy Treatment  Patient Details  Name: Daniel Francis MRN: [id number] Date of Birth: 01-04-1971 Referring Provider (PT): Dr Roda Shutters   Encounter Date: 02/12/2018  PT End of Session - 02/12/18 0937    Visit Number  4    Number of Visits  12    Date for PT Re-Evaluation  03/14/18    Authorization Type  Self Pay    PT Start Time  0930    PT Stop Time  1025    PT Time Calculation (min)  55 min    Activity Tolerance  Patient tolerated treatment well    Behavior During Therapy  [facility] for tasks assessed/performed       History reviewed. No pertinent past medical history.  History reviewed. No pertinent surgical history.  There were no vitals filed for this visit.  Subjective Assessment - 02/12/18 0933    Subjective  Pt. felt fine after last visit.  Reporting mostly pain free with exception of "reaching out to side" and, still having most difficulty discomfort sleeping on R side in bed.      Pertinent History  HTN; denies any other medical problems     Diagnostic tests  xrays     Patient Stated Goals  99% better - using shoulder normally     Currently in Pain?  No/denies    Pain Score  0-No pain   Pt. noting 5/10 pain at R shoulder when turning on R side in bed    Pain Location  Shoulder    Pain Orientation  Right    Pain Descriptors / Indicators  Throbbing;Dull    Pain Type  Acute pain    Pain Radiating Towards  denies today    Pain Onset  More than a month ago    Pain Frequency  Intermittent    Aggravating Factors   sleeping on R side     Pain Relieving Factors  resting     Multiple Pain Sites  No                       [facility] Adult PT Treatment/Exercise - 02/12/18 0944      Shoulder Exercises: Supine   Other Supine Exercises  R shoulder ABC's with yellow med ball (2000Gr) x 2 rounds        Shoulder Exercises: Sidelying   ABduction  Right;15 reps    ABduction Limitations  scaption 0-90 dg; cues for slow performance required       Shoulder Exercises: Standing   External Rotation  15 reps;Right;Strengthening   cues for neutral posture    Theraband Level (Shoulder External Rotation)  Level 1 (Yellow)    External Rotation Limitations  isometric step outs     Internal Rotation  15 reps;Right;Strengthening    Internal Rotation Limitations  neutral shoulder with towel under elbow    Extension  15 reps;Both;Strengthening    Theraband Level (Shoulder Extension)  Level 2 (Red)    Row  Both;10 reps;Theraband;Strengthening    Theraband Level (Shoulder Row)  Level 3 (Green)   green on pt. request    Row Limitations  Cues for scapular retraction     Other Standing Exercises  B ER leaning on doorseal to pt. tolerance x 15 reps no resistance       Shoulder Exercises: Pulleys   Flexion  3 minutes    Scaption  3 minutes    Scaption Limitations  scaption/flexion       Shoulder Exercises: ROM/Strengthening   Lat Pull  15 reps    Lat Pull Limitations  15# - narrow grip    cues to avoid abduction positioning    Cybex Row  15 reps   Cues for full scapular retraction    Cybex Row Limitations  15# - low handles       Modalities   Modalities  Vasopneumatic      Vasopneumatic   Number Minutes Vasopneumatic   10 minutes    Vasopnuematic Location   Shoulder    Vasopneumatic Pressure  Medium    Vasopneumatic Temperature   coldest temp.                    PT Long Term Goals - 02/04/18 1015      PT LONG TERM GOAL #1   Title  Improve posture and alignment with patient to demonstrate upright posture with posterior shoulder girdle musculature engaged 03/14/18    Time  6    Period  Weeks    Status  On-going      PT LONG TERM GOAL #2   Title  Increased AROM Rt shoulder to equal of greater than AROM Lr shoulder 03/14/18    Time  6    Period  Weeks    Status  On-going      PT  LONG TERM GOAL #3   Title  Functional strength 4+/5 to 5/5 Rt UE 03/14/18    Time  6    Period  Weeks    Status  On-going      PT LONG TERM GOAL #4   Title  Independent in HEP 03/14/18    Time  6    Period  Weeks    Status  On-going      PT LONG TERM GOAL #5   Title  Improve FOTO to </= 28% limitation 03/14/18    Time  6    Period  Weeks    Status  On-going            Plan - 02/12/18 0937    Clinical Impression Statement  [name] seems to be tolerating progression of strengthening and ROM therex well.  Able to tolerate slight progression into sidelying abduction and gravity minimized ER in pain free range well today.  Progressed to isometric ER step-outs with yellow TB resistance without pain.  Does require cueing for proper scapular mechanics with therex in session.  Still reports most pain with R sidelying in bed and "reaching out to side" motion.  Ended visit with ice/compression to shoulder to reduce post-exercise soreness and swelling.      Rehab Potential  Good    PT Frequency  2x / week    PT Duration  6 weeks    PT Treatment/Interventions  Patient/family education;ADLs/Self Care Home Management;Cryotherapy;Electrical Stimulation;Iontophoresis 4mg /ml Dexamethasone;Moist Heat;Ultrasound;Dry needling;Manual techniques;Neuromuscular re-education;Functional mobility training;Therapeutic activities;Therapeutic exercise    Consulted and Agree with Plan of Care  Patient       Patient will benefit from skilled therapeutic intervention in order to improve the following deficits and impairments:  Postural dysfunction, Improper body mechanics, Pain, Increased fascial restricitons, Increased muscle spasms, Decreased strength, Decreased mobility, Decreased range of motion, Decreased activity tolerance  Visit Diagnosis: Acute pain of right shoulder  Abnormal posture  Other symptoms and signs involving the musculoskeletal system  Muscle weakness (generalized)  Problem  List There are no active problems to display for this patient.   Kermit BaloMicah Brayam Boeke, PTA 02/12/18 12:13 PM    [facility] [address]  [address] [place], [place], [address] Phone: [phone]   Fax:  [phone]  Name: Felton ClintonKevin Stainback MRN: [id number] Date of Birth: February 21, 1970

## 2018-02-18 ENCOUNTER — Ambulatory Visit: Payer: Self-pay | Admitting: Physical Therapy

## 2018-02-18 ENCOUNTER — Encounter: Payer: Self-pay | Admitting: Physical Therapy

## 2018-02-18 DIAGNOSIS — M6281 Muscle weakness (generalized): Secondary | ICD-10-CM

## 2018-02-18 DIAGNOSIS — R29898 Other symptoms and signs involving the musculoskeletal system: Secondary | ICD-10-CM

## 2018-02-18 DIAGNOSIS — M25511 Pain in right shoulder: Secondary | ICD-10-CM

## 2018-02-18 DIAGNOSIS — R293 Abnormal posture: Secondary | ICD-10-CM

## 2018-02-18 NOTE — Therapy (Signed)
[place] [address]  [place], [place], [address] Phone: [phone]   Fax:  [phone]  Physical Therapy Treatment  Patient Details  Name: Daniel Francis MRN: [id number] Date of Birth: 30-Dec-1970 Referring Provider (PT): Azucena Cecil, MD   Encounter Date: 02/18/2018  PT End of Session - 02/18/18 0934    Visit Number  5    Number of Visits  12    Date for PT Re-Evaluation  03/14/18    Authorization Type  Self Pay    PT Start Time  8921    PT Stop Time  1030    PT Time Calculation (min)  56 min    Activity Tolerance  Patient tolerated treatment well    Behavior During Therapy  [facility] for tasks assessed/performed       History reviewed. No pertinent past medical history.  History reviewed. No pertinent surgical history.  There were no vitals filed for this visit.  Subjective Assessment - 02/18/18 0937    Subjective  Pt reporting improved ability to use R arm to wash his hair over the weekend.    Pertinent History  HTN; denies any other medical problems     Diagnostic tests  xrays     Patient Stated Goals  99% better - using shoulder normally     Currently in Pain?  No/denies    Pain Onset  More than a month ago         [facility] PT Assessment - 02/18/18 0934      Assessment   Medical Diagnosis  Rt shoulder dislocation     Referring Provider (PT)  N. Eduard Roux, MD    Onset Date/Surgical Date  01/01/18    Hand Dominance  Right    Next MD Visit  02/19/18      AROM   Right Shoulder Flexion  141 Degrees    Right Shoulder ABduction  130 Degrees    Right Shoulder Internal Rotation  67 Degrees    Right Shoulder External Rotation  69 Degrees      Strength   Right/Left Shoulder  Right;Left    Right Shoulder Flexion  5/5    Right Shoulder ABduction  5/5    Right Shoulder Internal Rotation  5/5    Right Shoulder External Rotation  5/5    Left Shoulder Flexion  4/5    Left Shoulder ABduction  4/5    Left  Shoulder Internal Rotation  4/5    Left Shoulder External Rotation  4-/5                   [facility] Adult PT Treatment/Exercise - 02/18/18 0934      Exercises   Exercises  Shoulder      Shoulder Exercises: Supine   Protraction  Right;15 reps;Strengthening    Protraction Weight (lbs)  2    Horizontal ABduction  Both;15 reps;Theraband;Strengthening    Theraband Level (Shoulder Horizontal ABduction)  Level 1 (Yellow)    Horizontal ABduction Limitations  cues for scap retraction    External Rotation  Both;10 reps;Theraband;Strengthening    Theraband Level (Shoulder External Rotation)  Level 1 (Yellow)    External Rotation Limitations  cues for scap retraction    Flexion  Right;15 reps;AAROM;Strengthening    Shoulder Flexion Weight (lbs)  2# on cane    Flexion Limitations  5" hold with wand      Shoulder Exercises: Sidelying   External Rotation  Right;15 reps;AROM  External Rotation Limitations  cues for scap retraction    ABduction  Right;15 reps;AROM    ABduction Limitations  scaption 30-110 (painfree); cues for scap retraction & depression    Other Sidelying Exercises  R shoulder horiz abduction + scap retraction x10 - PT guiding scapular motion      Shoulder Exercises: Therapy Ball   Flexion  Both;10 reps    Flexion Limitations  orange Pball on wall with R hand lift off at top of motion      Shoulder Exercises: ROM/Strengthening   UBE (Upper Arm Bike)  L2.0 x 6 min (3' fwd/3' back)    Wall Pushups  15 reps    Wall Pushups Limitations  on orange Pball      Modalities   Modalities  Vasopneumatic      Vasopneumatic   Number Minutes Vasopneumatic   10 minutes    Vasopnuematic Location   Shoulder    Vasopneumatic Pressure  Low    Vasopneumatic Temperature   coldest temp.        Manual Therapy   Manual Therapy  Soft tissue mobilization;Passive ROM;Scapular mobilization;Joint mobilization    Manual therapy comments  supine & L sidelying    Joint Mobilization  R  shoulder grade II-III inf glide for increasde flexion ROM    Soft tissue mobilization  STM/DTM to R posterior shoulder esp teres group & lats, pecs & deltoids    Scapular Mobilization  R scapula - all planes    Passive ROM  gentle R shoulder PROM into IR & ER with light stretch                  PT Long Term Goals - 02/18/18 1020      PT LONG TERM GOAL #1   Title  Improve posture and alignment with patient to demonstrate upright posture with posterior shoulder girdle musculature engaged 03/14/18    Status  Partially Met      PT LONG TERM GOAL #2   Title  Increased AROM Rt shoulder to equal or greater than AROM Lt shoulder 03/14/18    Status  On-going      PT LONG TERM GOAL #3   Title  Functional strength 4+/5 to 5/5 Rt UE 03/14/18    Status  On-going      PT LONG TERM GOAL #4   Title  Independent in HEP 03/14/18    Status  Partially Met      PT LONG TERM GOAL #5   Title  Improve FOTO to </= 28% limitation 03/14/18    Status  On-going            Plan - 02/18/18 0938    Clinical Impression Statement  Daniel Francis reporting increasing ability to use R UE functionally with tasks such as washing hair, but still feels that R shoulder is limited as compared to L. Signficant gains noted in R shoulder ROM in all planes, with remaining restrictions due to tightness rather than pain. R shoulder strength progressing with greatest weakness remaining in ER with ongoing cueing necessary for scapular activation with some motions especially ER and horizontal abduction. Pt will continue to benefit from skilled PT to restore full R shoulder AROM and strength for improved fucntional use of R UE with decreased risk for further subluxation/dislocation.    Rehab Potential  Good    PT Frequency  2x / week    PT Duration  6 weeks    PT Treatment/Interventions  Patient/family education;ADLs/Self Care  Home Management;Cryotherapy;Electrical Stimulation;Iontophoresis 89m/ml Dexamethasone;Moist  Heat;Ultrasound;Dry needling;Manual techniques;Neuromuscular re-education;Functional mobility training;Therapeutic activities;Therapeutic exercise    PT Next Visit Plan  continue work on posture and alignment; progress with posterior shoulder girdle strengthening; shoulder ROM; manual work and modalities as indicated     COncologistwith Plan of Care  Patient       Patient will benefit from skilled therapeutic intervention in order to improve the following deficits and impairments:  Postural dysfunction, Improper body mechanics, Pain, Increased fascial restricitons, Increased muscle spasms, Decreased strength, Decreased mobility, Decreased range of motion, Decreased activity tolerance  Visit Diagnosis: Acute pain of right shoulder  Abnormal posture  Other symptoms and signs involving the musculoskeletal system  Muscle weakness (generalized)     Problem List There are no active problems to display for this patient.   JPercival Spanish PT, MPT 02/18/2018, 12:14 PM  [facility] [place] [address]Phone: [phone]  Fax:  [phone] Name: KCambell StanekMRN: [id number] of Birth: [birth year]

## 2018-02-19 ENCOUNTER — Ambulatory Visit (INDEPENDENT_AMBULATORY_CARE_PROVIDER_SITE_OTHER): Payer: Self-pay | Admitting: Orthopaedic Surgery

## 2018-02-19 ENCOUNTER — Encounter (INDEPENDENT_AMBULATORY_CARE_PROVIDER_SITE_OTHER): Payer: Self-pay | Admitting: Orthopaedic Surgery

## 2018-02-19 VITALS — Ht 66.0 in | Wt 185.0 lb

## 2018-02-19 DIAGNOSIS — S43014D Anterior dislocation of right humerus, subsequent encounter: Secondary | ICD-10-CM

## 2018-02-19 DIAGNOSIS — S43014A Anterior dislocation of right humerus, initial encounter: Secondary | ICD-10-CM | POA: Insufficient documentation

## 2018-02-19 NOTE — Progress Notes (Signed)
   Office Visit Note   Patient: Daniel Francis           Date of Birth: 10/24/1970           MRN: [id number] Visit Date: 02/19/2018              Requested by: Lasandra BeechSwaney, Cathy Judge, MD [address] [place], [place] [address] PCP: Lasandra BeechSwaney, Cathy Judge, MD   Assessment & Plan: Visit Diagnoses:  1. Anterior dislocation of right shoulder, subsequent encounter     Plan: Impression is approximately 6 weeks status post right anterior shoulder dislocation with a small bony [place].  At this point, would like for the patient to continue with physical therapy to work on range of motion and strength.  We will write him a work note to return on 03/03/2017 without restrictions, although the patient is aware of no lifting over the shoulders.  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  Follow-up with us in 6 weeks time for recheck.  Follow-Up Instructions: Return in about 6 weeks (around 04/02/2018).   Orders:  No orders of the defined types were placed in this encounter.  No orders of the defined types were placed in this encounter.     Procedures: No procedures performed   Clinical Data: No additional findings.   Subjective: Chief Complaint  Patient presents with  . Right Shoulder - Follow-up    HPI patient is a pleasant 47 year old gentleman who presents to our clinic today approximately 6 weeks status post nontraumatic anterior dislocation right shoulder.  He has been in physical therapy.  He has regained a fair amount of motion and strength, but is not 100% there.  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  Overall doing much better.  Review of Systems as detailed in HPI.  All others reviewed and are negative.   Objective: Vital Signs: Ht 5\' 6"  (1.676 m)   Wt 185 lb (83.9 kg)   BMI 29.86 kg/m   Physical Exam well-developed well-nourished gentleman no acute distress.  Alert and oriented x3.  Ortho Exam  examination of his right shoulder reveals 80% range of motion.  4-5 strength throughout.  He is neurovascularly intact distally.  Specialty Comments:  No specialty comments available.  Imaging: No new imaging   PMFS History: Patient Active Problem List   Diagnosis Date Noted  . Anterior dislocation of right shoulder 02/19/2018   History reviewed. No pertinent past medical history.  History reviewed. No pertinent family history.  History reviewed. No pertinent surgical history. Social History   Occupational History  . Not on file  Tobacco Use  . Smoking status: Never Smoker  . Smokeless tobacco: Never Used  Substance and Sexual Activity  . Alcohol use: Not on file  . Drug use: Not on file  . Sexual activity: Not on file

## 2018-02-21 ENCOUNTER — Ambulatory Visit: Payer: Self-pay | Attending: Orthopaedic Surgery

## 2018-02-21 DIAGNOSIS — M6281 Muscle weakness (generalized): Secondary | ICD-10-CM | POA: Insufficient documentation

## 2018-02-21 DIAGNOSIS — R293 Abnormal posture: Secondary | ICD-10-CM | POA: Insufficient documentation

## 2018-02-21 DIAGNOSIS — R29898 Other symptoms and signs involving the musculoskeletal system: Secondary | ICD-10-CM | POA: Insufficient documentation

## 2018-02-21 DIAGNOSIS — M25511 Pain in right shoulder: Secondary | ICD-10-CM | POA: Insufficient documentation

## 2018-02-21 NOTE — Therapy (Signed)
[facility] [place] [address]  [place], [place], [address] Phone: [phone]   Fax:  [phone]  Physical Therapy Treatment  Patient Details  Name: Daniel Francis MRN: [id number] Date of Birth: December 31, 1970 Referring Provider (PT): Azucena Cecil, MD   Encounter Date: 02/21/2018  PT End of Session - 02/21/18 0950    Visit Number  6    Number of Visits  12    Date for PT Re-Evaluation  03/14/18    Authorization Type  Self Pay    PT Start Time  0945   Pt. arrived 15 min late    PT Stop Time  1025    PT Time Calculation (min)  40 min    Activity Tolerance  Patient tolerated treatment well    Behavior During Therapy  [facility] for tasks assessed/performed       No past medical history on file.  No past surgical history on file.  There were no vitals filed for this visit.  Subjective Assessment - 02/21/18 0948    Subjective  Pt. noting he overdid it a bit on new years eve and had some shoulder soreness which subsided.      Pertinent History  HTN; denies any other medical problems     Diagnostic tests  xrays     Patient Stated Goals  99% better - using shoulder normally     Currently in Pain?  No/denies    Pain Score  0-No pain    Multiple Pain Sites  No                       [facility] Adult PT Treatment/Exercise - 02/21/18 1005      Shoulder Exercises: Sidelying   External Rotation  Right;15 reps    External Rotation Weight (lbs)  1   no resistance for 5 reps, then 1# for last 10 reps    External Rotation Limitations  cues for positioning and for scap retraction      Shoulder Exercises: Standing   Flexion  Right;AAROM;10 reps    Flexion Limitations  1# at wrist; orange p-ball on wall     ABduction  Right;10 reps;AAROM    ABduction Limitations  1# at wrist; orange p-ball on wall     Other Standing Exercises  B ER with yellow TB leaning on doorseal x 10 reps; cues to avoid painful ROM       Shoulder  Exercises: ROM/Strengthening   UBE (Upper Arm Bike)  L3.0 x 6 min (3' fwd/3' back)      Vasopneumatic   Number Minutes Vasopneumatic   10 minutes    Vasopnuematic Location   Shoulder    Vasopneumatic Pressure  Low    Vasopneumatic Temperature   coldest temp.        Manual Therapy   Manual Therapy  Passive ROM    Manual therapy comments  supine     Soft tissue mobilization  STM to R lateral deltoid in area of reported tenderness    Passive ROM  R shoulder PROM with gentle holds in all directions - improved ER with prolonged hold with reduction in muscular guarding                   PT Long Term Goals - 02/18/18 1020      PT LONG TERM GOAL #1   Title  Improve posture and alignment with patient to demonstrate upright posture with posterior  shoulder girdle musculature engaged 03/14/18    Status  Partially Met      PT LONG TERM GOAL #2   Title  Increased AROM Rt shoulder to equal or greater than AROM Lt shoulder 03/14/18    Status  On-going      PT LONG TERM GOAL #3   Title  Functional strength 4+/5 to 5/5 Rt UE 03/14/18    Status  On-going      PT LONG TERM GOAL #4   Title  Independent in HEP 03/14/18    Status  Partially Met      PT LONG TERM GOAL #5   Title  Improve FOTO to </= 28% limitation 03/14/18    Status  On-going            Plan - 02/21/18 0950    Clinical Impression Statement  Pt. arrived 15 min late to session thus treatment time limited.  Pt. with complaint of some lateral R shoulder tenderness following, "overdoing it" a few days ago.  Pt. ttp in R lateral deltoid with STM however responded well and able to progress standing therex without pain today.  Did note low-level discomfort following B ER with yellow TB resistance thus ended session with ice/compression to R shoulder to reduce post-exercise soreness and swelling.  Pt. verbalizing plans to return to work at full duty on 1.13.19 per MD.  Progressing well toward goals.      Rehab Potential  Good     PT Frequency  2x / week    PT Duration  6 weeks    PT Treatment/Interventions  Patient/family education;ADLs/Self Care Home Management;Cryotherapy;Electrical Stimulation;Iontophoresis 72m/ml Dexamethasone;Moist Heat;Ultrasound;Dry needling;Manual techniques;Neuromuscular re-education;Functional mobility training;Therapeutic activities;Therapeutic exercise    PT Next Visit Plan  continue work on posture and alignment; progress with posterior shoulder girdle strengthening; shoulder ROM; manual work and modalities as indicated     COncologistwith Plan of Care  Patient       Patient will benefit from skilled therapeutic intervention in order to improve the following deficits and impairments:  Postural dysfunction, Improper body mechanics, Pain, Increased fascial restricitons, Increased muscle spasms, Decreased strength, Decreased mobility, Decreased range of motion, Decreased activity tolerance  Visit Diagnosis: Acute pain of right shoulder  Abnormal posture  Other symptoms and signs involving the musculoskeletal system  Muscle weakness (generalized)     Problem List Patient Active Problem List   Diagnosis Date Noted  . Anterior dislocation of right shoulder 02/19/2018    MBess Harvest PTA 02/21/18 12:36 PM   [facility] [place] [address]Phone: [phone]  Fax:  [phone] Name: Daniel DearmasMRN: [id number] of Birth: [birth year]

## 2018-02-25 ENCOUNTER — Ambulatory Visit: Payer: Self-pay

## 2018-02-25 DIAGNOSIS — M25511 Pain in right shoulder: Secondary | ICD-10-CM

## 2018-02-25 DIAGNOSIS — R293 Abnormal posture: Secondary | ICD-10-CM

## 2018-02-25 DIAGNOSIS — R29898 Other symptoms and signs involving the musculoskeletal system: Secondary | ICD-10-CM

## 2018-02-25 DIAGNOSIS — M6281 Muscle weakness (generalized): Secondary | ICD-10-CM

## 2018-02-25 NOTE — Therapy (Signed)
[facility] [address]  [place], [place], [address] Phone: [phone]   Fax:  [phone]  Physical Therapy Treatment  Patient Details  Name: Daniel Francis MRN: [id number] Date of Birth: 01/24/1971 Referring Provider (PT): Azucena Cecil, MD   Encounter Date: 02/25/2018  PT End of Session - 02/25/18 0934    Visit Number  7    Number of Visits  12    Date for PT Re-Evaluation  03/14/18    Authorization Type  Self Pay    PT Start Time  0930    PT Stop Time  1013    PT Time Calculation (min)  43 min    Activity Tolerance  Patient tolerated treatment well    Behavior During Therapy  [facility] for tasks assessed/performed       No past medical history on file.  No past surgical history on file.  There were no vitals filed for this visit.  Subjective Assessment - 02/25/18 0932    Subjective  Pt. reporting R shoulder felt fine over the weekend.      Pertinent History  HTN; denies any other medical problems     Diagnostic tests  xrays     Patient Stated Goals  99% better - using shoulder normally     Currently in Pain?  No/denies    Pain Score  0-No pain    Multiple Pain Sites  No                       [facility] Adult PT Treatment/Exercise - 02/25/18 0939      Shoulder Exercises: Supine   Protraction  Both;15 reps    Protraction Weight (lbs)  3    Protraction Limitations  Hooklying on pool noodle     Horizontal ABduction  Both;10 reps;Theraband;Strengthening    Theraband Level (Shoulder Horizontal ABduction)  Level 2 (Red)    Horizontal ABduction Limitations  Hooklying on pool noodle     External Rotation  Both;15 reps    Theraband Level (Shoulder External Rotation)  Level 1 (Yellow)    External Rotation Limitations  Hooklying on pool noodle; cues for scap retraction    Other Supine Exercises  Sapular retraction hooklying on pool noodle 5" x 10       Shoulder Exercises: Sidelying   External Rotation   Right;15 reps    External Rotation Weight (lbs)  1    External Rotation Limitations  cues for positioning and for scap retraction    ABduction  Right;10 reps;Strengthening;Weights    ABduction Weight (lbs)  1      Shoulder Exercises: Standing   External Rotation  Right;10 reps    Theraband Level (Shoulder External Rotation)  Level 1 (Yellow)    Flexion  Both;10 reps    Flexion Limitations  leaning on pool noodle on wall for scapular retraction cueing initially     ABduction  Both;10 reps    ABduction Limitations  scaption; leaning on pool noodle on wall       Shoulder Exercises: ROM/Strengthening   UBE (Upper Arm Bike)  L3.0 x 6 min (3' fwd/3' back)    Cybex Row  15 reps   3" hold    Cybex Row Limitations  20# - low handles       Shoulder Exercises: Stretch   Corner Stretch  1 rep;30 seconds    Corner Stretch Limitations  gentle low on door seal  Manual Therapy   Manual Therapy  Passive ROM;Muscle Energy Technique    Manual therapy comments  supine     Soft tissue mobilization  STM to R pec in supine horizontal abduction stretch with light overpressure into R pec stretch x 1 min     Passive ROM  R shoulder PROM with gentle holds in all directions     Muscle Energy Technique  R shoulder gentle contract/relax stretching x 3 rounds with therapist manual resistance in all directions for improvement in ROM - visible reduction in muscular guarding                   PT Long Term Goals - 02/18/18 1020      PT LONG TERM GOAL #1   Title  Improve posture and alignment with patient to demonstrate upright posture with posterior shoulder girdle musculature engaged 03/14/18    Status  Partially Met      PT LONG TERM GOAL #2   Title  Increased AROM Rt shoulder to equal or greater than AROM Lt shoulder 03/14/18    Status  On-going      PT LONG TERM GOAL #3   Title  Functional strength 4+/5 to 5/5 Rt UE 03/14/18    Status  On-going      PT LONG TERM GOAL #4   Title   Independent in HEP 03/14/18    Status  Partially Met      PT LONG TERM GOAL #5   Title  Improve FOTO to </= 28% limitation 03/14/18    Status  On-going            Plan - 02/25/18 0934    Clinical Impression Statement  Lennette Bihari reporting he felt fine after last visit.  No new complaints today.  Had most difficulty in today's session with standing yellow TB resisted shoulder ER demonstrating scapular and triceps substitution requiring cueing to correct.  Visible improvement in shoulder ROM and reduction in guarding today following manual contract/relax stretching with therapist.  Ended visit pain free thus modalities deferred.  Progressing well toward goals.      Rehab Potential  Good    PT Frequency  2x / week    PT Duration  6 weeks    PT Treatment/Interventions  Patient/family education;ADLs/Self Care Home Management;Cryotherapy;Electrical Stimulation;Iontophoresis 7m/ml Dexamethasone;Moist Heat;Ultrasound;Dry needling;Manual techniques;Neuromuscular re-education;Functional mobility training;Therapeutic activities;Therapeutic exercise    PT Next Visit Plan  continue work on posture and alignment; progress with posterior shoulder girdle strengthening; shoulder ROM; manual work and modalities as indicated     COncologistwith Plan of Care  Patient       Patient will benefit from skilled therapeutic intervention in order to improve the following deficits and impairments:  Postural dysfunction, Improper body mechanics, Pain, Increased fascial restricitons, Increased muscle spasms, Decreased strength, Decreased mobility, Decreased range of motion, Decreased activity tolerance  Visit Diagnosis: Acute pain of right shoulder  Abnormal posture  Other symptoms and signs involving the musculoskeletal system  Muscle weakness (generalized)     Problem List Patient Active Problem List   Diagnosis Date Noted  . Anterior dislocation of right shoulder 02/19/2018   MBess Harvest  PTA 02/25/18 12:19 PM   [facility] [address] [place] [address]Phone: [phone]  Fax:  [phone] Name: Daniel GlantzMRN: [id number] of Birth: 128-Jul-1972

## 2018-02-28 ENCOUNTER — Encounter: Payer: Self-pay | Admitting: Physical Therapy

## 2018-02-28 ENCOUNTER — Ambulatory Visit: Payer: Self-pay | Admitting: Physical Therapy

## 2018-02-28 DIAGNOSIS — M6281 Muscle weakness (generalized): Secondary | ICD-10-CM

## 2018-02-28 DIAGNOSIS — M25511 Pain in right shoulder: Secondary | ICD-10-CM

## 2018-02-28 DIAGNOSIS — R29898 Other symptoms and signs involving the musculoskeletal system: Secondary | ICD-10-CM

## 2018-02-28 DIAGNOSIS — R293 Abnormal posture: Secondary | ICD-10-CM

## 2018-02-28 NOTE — Therapy (Addendum)
[facility] [address]  [place], [place], [address] Phone: [phone]   Fax:  [phone]  Physical Therapy Treatment / Discharge Summary  Patient Details  Name: Gregorio Worley MRN: [id number] Date of Birth: 05-20-1970 Referring Provider (PT): Azucena Cecil, MD   Encounter Date: 02/28/2018  PT End of Session - 02/28/18 0931    Visit Number  8    Number of Visits  12    Date for PT Re-Evaluation  03/14/18    Authorization Type  Self Pay    PT Start Time  7253    PT Stop Time  1015    PT Time Calculation (min)  44 min    Activity Tolerance  Patient tolerated treatment well    Behavior During Therapy  [facility] for tasks assessed/performed       History reviewed. No pertinent past medical history.  History reviewed. No pertinent surgical history.  There were no vitals filed for this visit.  Subjective Assessment - 02/28/18 0935    Subjective  Pt reporting PA cleared him to go back to work next week - would like to plan to try to continue with HEP at that time and hold off on further therapy visits. Pt reproting some shoulder soreness after painting over the weekend but now resolved.    Pertinent History  HTN; denies any other medical problems     Diagnostic tests  xrays     Patient Stated Goals  99% better - using shoulder normally     Currently in Pain?  No/denies         [facility] PT Assessment - 02/28/18 0931      Assessment   Medical Diagnosis  Rt shoulder dislocation     Referring Provider (PT)  N. Eduard Roux, MD    Onset Date/Surgical Date  01/01/18    Hand Dominance  Right    Next MD Visit  04/02/18      AROM   Right Shoulder Flexion  144 Degrees   162 in supine   Right Shoulder ABduction  132 Degrees    Right Shoulder Internal Rotation  84 Degrees    Right Shoulder External Rotation  75 Degrees      Strength   Right Shoulder Flexion  4/5    Right Shoulder ABduction  4/5    Right Shoulder Internal  Rotation  4+/5    Right Shoulder External Rotation  4-/5    Left Shoulder Flexion  5/5    Left Shoulder ABduction  5/5    Left Shoulder Internal Rotation  5/5    Left Shoulder External Rotation  5/5                   [facility] Adult PT Treatment/Exercise - 02/28/18 0931      Exercises   Exercises  Shoulder      Shoulder Exercises: Sidelying   External Rotation  Right;15 reps;AROM    External Rotation Limitations  cues for scap retraction - limited motion    ABduction  Right;15 reps;AROM    ABduction Limitations  scaption 30-110 (painfree); cues for scap retraction & depression      Shoulder Exercises: Standing   External Rotation  Right;10 reps;AROM;Theraband;Strengthening    Theraband Level (Shoulder External Rotation)  Level 1 (Yellow)    External Rotation Limitations  neutral shoulder with towel under elbow; limited control/ROM with resisted AROM, therefore encouraged pt to perform AROM w/o resistance at home continue  with isometric strengthening at this time    Internal Rotation  Right;10 reps;Theraband;Strengthening    Theraband Level (Shoulder Internal Rotation)  Level 2 (Red)    Internal Rotation Limitations  neutral shoulder with towel under elbow    Extension  Both;15 reps;Theraband;Strengthening    Theraband Level (Shoulder Extension)  Level 2 (Red)    Row  Both;15 reps;Theraband;Strengthening    Theraband Level (Shoulder Row)  Level 2 (Red)    Row Limitations  cues to avoid shoulder hike      Shoulder Exercises: ROM/Strengthening   UBE (Upper Arm Bike)  L3.0 x 6 min (3' fwd/3' back)    Lat Pull  15 reps    Lat Pull Limitations  15#    Wall Wash  R shoulder flexion & scaption/abduction + lift off x10 each    Ball on Wall  R shoulder small CW/CCW circles with small ball x10 each             PT Education - 02/28/18 1015    Education Details  HEP review/update with instructions for self progression at home    Person(s) Educated  Patient    Methods   Explanation;Demonstration;Handout    Comprehension  Verbalized understanding;Returned demonstration          PT Long Term Goals - 02/28/18 0938      PT LONG TERM GOAL #1   Title  Improve posture and alignment with patient to demonstrate upright posture with posterior shoulder girdle musculature engaged 03/14/18    Status  Achieved      PT LONG TERM GOAL #2   Title  Increased AROM Rt shoulder to equal or greater than AROM Lt shoulder 03/14/18    Status  Not Met      PT LONG TERM GOAL #3   Title  Functional strength 4+/5 to 5/5 Rt UE 03/14/18    Status  Not Met      PT LONG TERM GOAL #4   Title  Independent in HEP 03/14/18    Status  Achieved      PT LONG TERM GOAL #5   Title  Improve FOTO to </= 28% limitation 03/14/18    Status  Unable to assess            Plan - 02/28/18 0939    Clinical Impression Statement  Naethan reporting he was cleared to return to work next week and states he would like to try continuing on his own with the home exercises at that point. HEP reviewed and updated accordingly with instruction provided in self-progression of exercises emphasizing good control throughout ROM before adding/progressing resistance. R shoulder ROM now [facility] but remains slighly limited as compared to L with signifcant weakness still evident especially in ER. Goals only partially met as pt transitioning to HEP prior to end of POC. Pt will be placed on hold for 30 days in the event that he needs to return for further guidance with HEP progression or experiences issues with return to work.    Rehab Potential  Good    PT Frequency  2x / week    PT Duration  6 weeks    PT Treatment/Interventions  Patient/family education;ADLs/Self Care Home Management;Cryotherapy;Electrical Stimulation;Iontophoresis 48m/ml Dexamethasone;Moist Heat;Ultrasound;Dry needling;Manual techniques;Neuromuscular re-education;Functional mobility training;Therapeutic activities;Therapeutic exercise    PT Next Visit  Plan  30 day hold    Consulted and Agree with Plan of Care  Patient       Patient will benefit from skilled therapeutic intervention in  order to improve the following deficits and impairments:  Postural dysfunction, Improper body mechanics, Pain, Increased fascial restricitons, Increased muscle spasms, Decreased strength, Decreased mobility, Decreased range of motion, Decreased activity tolerance  Visit Diagnosis: Acute pain of right shoulder  Abnormal posture  Other symptoms and signs involving the musculoskeletal system  Muscle weakness (generalized)     Problem List Patient Active Problem List   Diagnosis Date Noted  . Anterior dislocation of right shoulder 02/19/2018    Percival Spanish, PT, MPT 02/28/2018, 12:23 PM  [facility] [address]  [place], [place], [address] Phone: [phone]   Fax:  [phone]  Name: Lela Gell MRN: [id number] Date of Birth: [birth year]  PHYSICAL THERAPY DISCHARGE SUMMARY  Visits from Start of Care: 8  Current functional level related to goals / functional outcomes:   Refer to above clinical impression for status as of last visit on 02/28/18. Pt was placed on hold for 30 days and has not needed to return to PT, therefore will proceed with discharge from PT for this episode.   Remaining deficits:   As above.   Education / Equipment:   HEP  Plan: Patient agrees to discharge.  Patient goals were partially met. Patient is being discharged due to the patient's request.  ?????     Percival Spanish, PT, MPT 04/11/18, 11:51 AM  [facility] [address]  [place], [place], [address] Phone: [phone]   Fax:  [phone]

## 2018-04-02 ENCOUNTER — Encounter (INDEPENDENT_AMBULATORY_CARE_PROVIDER_SITE_OTHER): Payer: Self-pay | Admitting: Orthopaedic Surgery

## 2018-04-02 ENCOUNTER — Other Ambulatory Visit (INDEPENDENT_AMBULATORY_CARE_PROVIDER_SITE_OTHER): Payer: Self-pay | Admitting: Physician Assistant

## 2018-04-02 ENCOUNTER — Ambulatory Visit (INDEPENDENT_AMBULATORY_CARE_PROVIDER_SITE_OTHER): Payer: Self-pay | Admitting: Orthopaedic Surgery

## 2018-04-02 DIAGNOSIS — S43014D Anterior dislocation of right humerus, subsequent encounter: Secondary | ICD-10-CM

## 2018-04-02 MED ORDER — TRAMADOL HCL 50 MG PO TABS: 50.0000 mg | ORAL_TABLET | Freq: Three times a day (TID) | ORAL | 1 refills | Status: AC | PRN

## 2018-04-02 MED FILL — traMADol HCL 50 MG TABS: 50 | 7 days supply | Qty: 21 | Fill #0

## 2018-04-02 NOTE — Progress Notes (Unsigned)
t

## 2018-04-02 NOTE — Progress Notes (Signed)
   Office Visit Note   Patient: Daniel Francis           Date of Birth: July 22, 1970           MRN: [id number] Visit Date: 04/02/2018              Requested by: Lasandra Beech, MD [address] [facility] [address] [place], [place] [address] PCP: Lasandra Beech, MD   Assessment & Plan: Visit Diagnoses:  1. Anterior dislocation of right shoulder, subsequent encounter     Plan: Impression is 12 weeks status post a traumatic anterior right shoulder dislocation.  Patient will continue to work on strengthening exercises.  He will advance with activity as tolerated.  He will follow-up with [place] as needed.  Follow-Up Instructions: Return if symptoms worsen or fail to improve.   Orders:  No orders of the defined types were placed in this encounter.  No orders of the defined types were placed in this encounter.     Procedures: No procedures performed   Clinical Data: No additional findings.   Subjective: Chief Complaint  Patient presents with  . Right Shoulder - Pain, Follow-up    HPI patient is a pleasant 48 year old gentleman presents our clinic today following his third right shoulder dislocation.  This occurred approximately 12 weeks ago.  This was an atraumatic anterior shoulder dislocation.  He has been in formal physical therapy, but over the past 6 weeks has been working on a home exercise program only.  He is regained full range of motion and near full strength.  He still has some soreness at night.  He has returned to work without any issues.  Review of Systems as detailed in HPI.  All others reviewed and are negative.   Objective: Vital Signs: There were no vitals taken for this visit.  Physical Exam well-developed well-nourished gentleman in no acute distress.  Alert and oriented x3.  Ortho Exam examination of the right shoulder reveals full active range of motion all planes.  No apprehension.  He has 4 out of 5 strength with resisted external rotation.  Otherwise, full  strength throughout.  He is neurovascularly intact distally.  Specialty Comments:  No specialty comments available.  Imaging: No new imaging   PMFS History: Patient Active Problem List   Diagnosis Date Noted  . Anterior dislocation of right shoulder 02/19/2018   History reviewed. No pertinent past medical history.  History reviewed. No pertinent family history.  History reviewed. No pertinent surgical history. Social History   Occupational History  . Not on file  Tobacco Use  . Smoking status: Never Smoker  . Smokeless tobacco: Never Used  Substance and Sexual Activity  . Alcohol use: Not on file  . Drug use: Not on file  . Sexual activity: Not on file

## 2019-04-19 IMAGING — DX DG SHOULDER 2+V*R*
1 series · 1 of 1 positions shown · non-contrast
Comparison: None

CLINICAL DATA: Injury after rolling over in bed this morning

EXAM:
RIGHT SHOULDER - 1 VIEW

[shoulder ap]
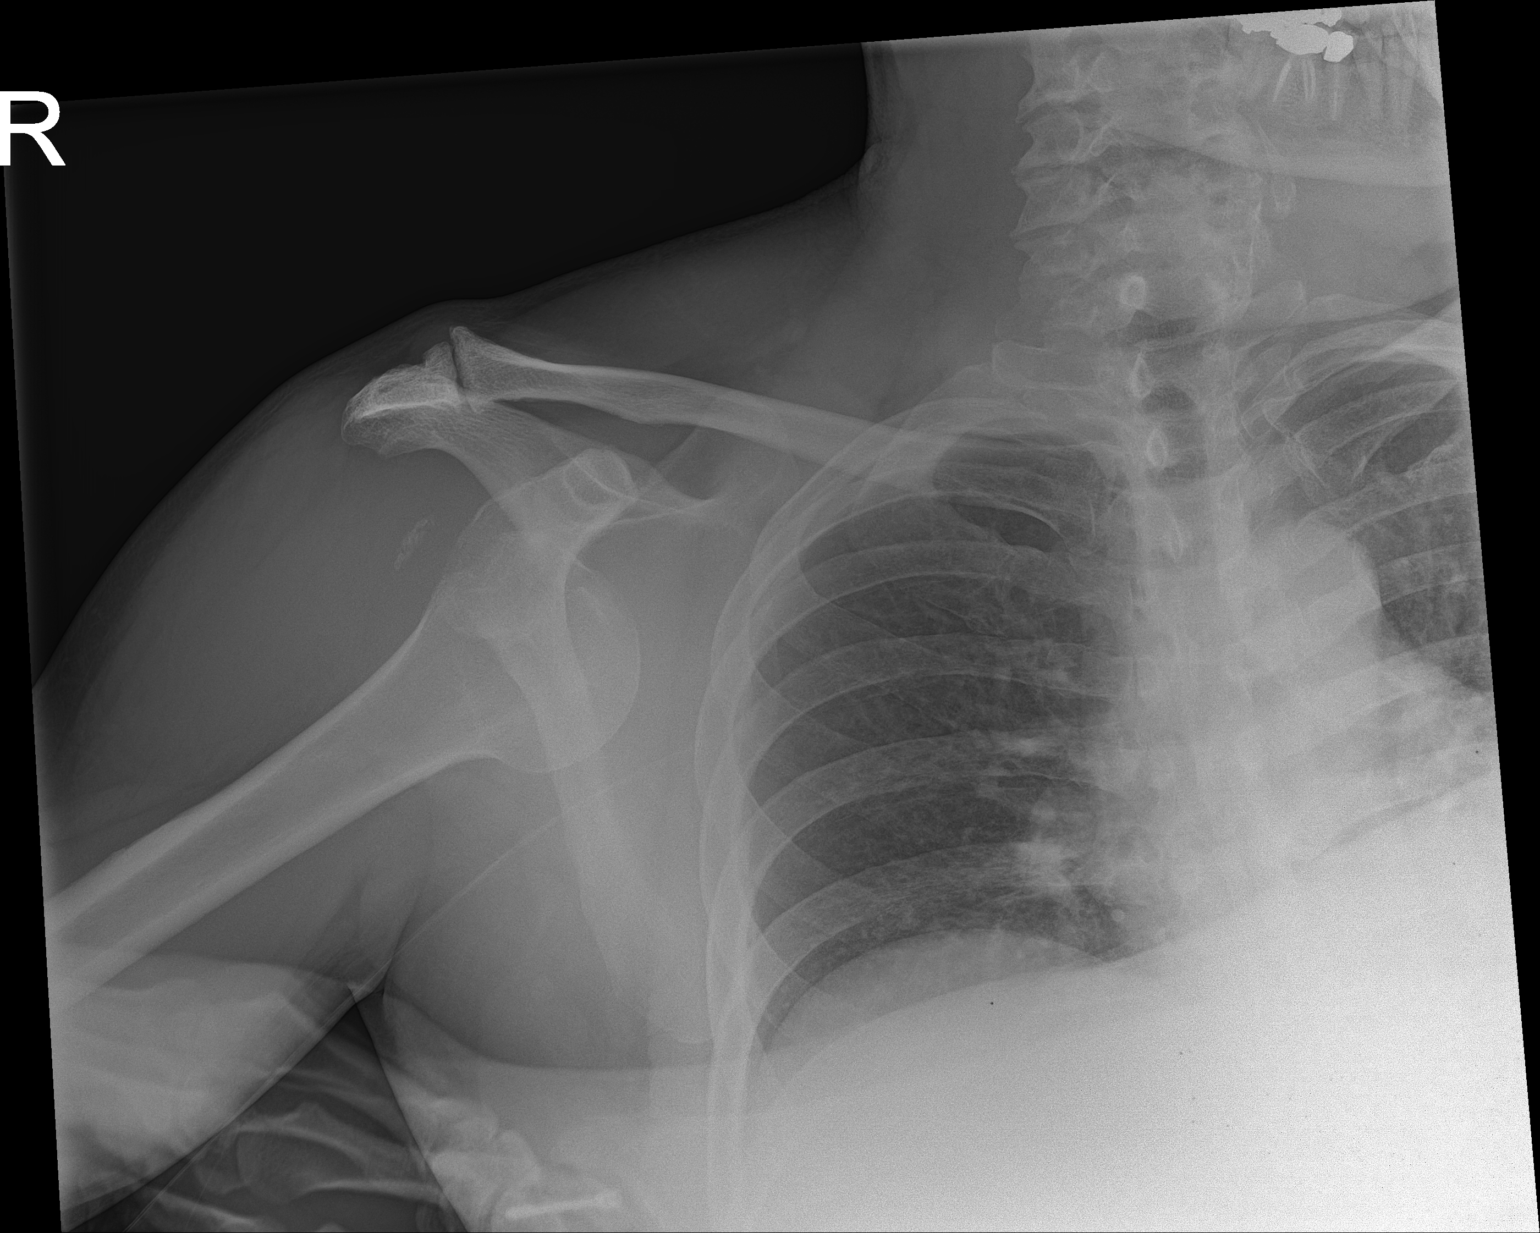

[1 of 1 positions shown; findings below may reference images not displayed]

FINDINGS: Single AP view.

Inferior RIGHT glenohumeral dislocation identified.

Slightly ill-defined density is seen adjacent to the RIGHT
glenohumeral joint.

Osseous mineralization normal.

Degenerative changes at RIGHT AC joint.

No additional fractures identified.

Visualized RIGHT ribs intact.
IMPRESSION: Inferior RIGHT glenohumeral dislocation.

Calcific density adjacent to the shoulder joint is seen, cannot
exclude an atypical avulsion fragment though this could be related
to other causes including calcific tendinopathy, calcified loose
body, capsular calcification.
